# Patient Record
Sex: Female | Born: 1995 | Race: Black or African American | Hispanic: No | Marital: Single | Smoking: Never smoker
Health system: Southern US, Community
[De-identification: ages and names within clinical notes are randomized; demographics above are authoritative.]

---
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
## 2023-03-12 ENCOUNTER — Emergency Department (HOSPITAL_COMMUNITY)
Admission: EM | Admit: 2023-03-12 | Discharge: 2023-03-12 | Disposition: A | Discharge status: Home / Self Care | Payer: 59 | Attending: Emergency Medicine | Admitting: Emergency Medicine

## 2023-03-12 ENCOUNTER — Encounter (HOSPITAL_COMMUNITY): Payer: Self-pay | Admitting: Emergency Medicine

## 2023-03-12 ENCOUNTER — Other Ambulatory Visit: Payer: Self-pay

## 2023-03-12 ENCOUNTER — Emergency Department (HOSPITAL_COMMUNITY): Payer: 59

## 2023-03-12 DIAGNOSIS — R739 Hyperglycemia, unspecified: Secondary | ICD-10-CM | POA: Insufficient documentation

## 2023-03-12 DIAGNOSIS — R0602 Shortness of breath: Secondary | ICD-10-CM | POA: Insufficient documentation

## 2023-03-12 DIAGNOSIS — R112 Nausea with vomiting, unspecified: Secondary | ICD-10-CM

## 2023-03-12 DIAGNOSIS — E876 Hypokalemia: Secondary | ICD-10-CM | POA: Diagnosis not present

## 2023-03-12 DIAGNOSIS — E860 Dehydration: Secondary | ICD-10-CM | POA: Insufficient documentation

## 2023-03-12 LAB — CBC WITH DIFFERENTIAL/PLATELET
Abs Immature Granulocytes: 0.02 10*3/uL (ref 0.00–0.07)
Basophils Absolute: 0 10*3/uL (ref 0.0–0.1)
Basophils Relative: 0 %
Eosinophils Absolute: 0.1 10*3/uL (ref 0.0–0.5)
Eosinophils Relative: 1 %
HCT: 38.8 % (ref 36.0–46.0)
Hemoglobin: 12.4 g/dL (ref 12.0–15.0)
Immature Granulocytes: 0 %
Lymphocytes Relative: 57 %
Lymphs Abs: 5.2 10*3/uL — ABNORMAL HIGH (ref 0.7–4.0)
MCH: 30.8 pg (ref 26.0–34.0)
MCHC: 32 g/dL (ref 30.0–36.0)
MCV: 96.3 fL (ref 80.0–100.0)
Monocytes Absolute: 0.5 10*3/uL (ref 0.1–1.0)
Monocytes Relative: 6 %
Neutro Abs: 3.2 10*3/uL (ref 1.7–7.7)
Neutrophils Relative %: 36 %
Platelets: 348 10*3/uL (ref 150–400)
RBC: 4.03 MIL/uL (ref 3.87–5.11)
RDW: 13.2 % (ref 11.5–15.5)
WBC: 9.1 10*3/uL (ref 4.0–10.5)
nRBC: 0 % (ref 0.0–0.2)

## 2023-03-12 LAB — COMPREHENSIVE METABOLIC PANEL
ALT: 12 U/L (ref 0–44)
AST: 29 U/L (ref 15–41)
Albumin: 3.6 g/dL (ref 3.5–5.0)
Alkaline Phosphatase: 75 U/L (ref 38–126)
Anion gap: 19 — ABNORMAL HIGH (ref 5–15)
BUN: 11 mg/dL (ref 6–20)
CO2: 15 mmol/L — ABNORMAL LOW (ref 22–32)
Calcium: 8.7 mg/dL — ABNORMAL LOW (ref 8.9–10.3)
Chloride: 103 mmol/L (ref 98–111)
Creatinine, Ser: 0.91 mg/dL (ref 0.44–1.00)
GFR, Estimated: >60 mL/min (ref >60)
Glucose, Bld: 164 mg/dL — ABNORMAL HIGH (ref 70–99)
Potassium: 3.3 mmol/L — ABNORMAL LOW (ref 3.5–5.1)
Sodium: 137 mmol/L (ref 135–145)
Total Bilirubin: 0.2 mg/dL — ABNORMAL LOW (ref 0.3–1.2)
Total Protein: 6.2 g/dL — ABNORMAL LOW (ref 6.5–8.1)

## 2023-03-12 LAB — ETHANOL: Alcohol, Ethyl (B): <10 mg/dL (ref <10)

## 2023-03-12 LAB — D-DIMER, QUANTITATIVE: D-Dimer, Quant: <0.27 ug/mL-FEU (ref 0.00–0.50)

## 2023-03-12 LAB — I-STAT BETA HCG BLOOD, ED (MC, WL, AP ONLY): I-stat hCG, quantitative: <5 m[IU]/mL (ref <5)

## 2023-03-12 MED ORDER — ONDANSETRON 4 MG PO TBDP: 4.0000 mg | ORAL_TABLET | Freq: Three times a day (TID) | ORAL | 0 refills | Status: AC | PRN

## 2023-03-12 MED ORDER — SODIUM CHLORIDE 0.9 % IV BOLUS
1000.0000 mL | Freq: Once | INTRAVENOUS | Status: AC
  Administered 2023-03-12: 1000 mL via INTRAVENOUS

## 2023-03-12 MED ORDER — SODIUM CHLORIDE 0.9 % IV SOLN
8.0000 mg | Freq: Once | INTRAVENOUS | Status: AC
  Administered 2023-03-12: 8 mg via INTRAVENOUS
  Filled 2023-03-12: qty 4

## 2023-03-12 MED ORDER — POTASSIUM CHLORIDE CRYS ER 20 MEQ PO TBCR
40.0000 meq | EXTENDED_RELEASE_TABLET | Freq: Once | ORAL | Status: AC
  Administered 2023-03-12: 40 meq via ORAL
  Filled 2023-03-12: qty 2

## 2023-03-12 NOTE — ED Provider Notes (Signed)
[place] EMERGENCY DEPARTMENT AT [facility] Provider Note   CSN: [id number] Arrival date & time: 03/12/23  0035     History  Chief Complaint  Patient presents with   Shortness of Breath   Near Syncope          Rachel Jordan is a 27 y.o. female.  26 year old female with no significant past medical history presents with complaint of difficulty breathing.  Patient states that she had just smoked THC from a smoke shop when her symptoms started all of a sudden.  Patient arrived in the emergency room, had 1 episode of vomiting, felt slightly better after that however her shortness of breath returned when she was moved to a room and had to move from the wheelchair to the bed.  Denies leg swelling, recent extended travel, personal or family history of PE or DVT, or hormone therapy use.  Actively vomiting at time of exam.       Home Medications Prior to Admission medications   Medication Sig Start Date End Date Taking? Authorizing Provider  ondansetron (ZOFRAN-ODT) 4 MG disintegrating tablet Take 1 tablet (4 mg total) by mouth every 8 (eight) hours as needed for nausea or vomiting. 03/12/23  Yes Jeannie Fend, PA-C      Allergies    Patient has no allergy information on record.    Review of Systems   Review of Systems Negative except as per HPI Physical Exam Updated Vital Signs BP 111/67   Pulse 79   Temp 97.7 F (36.5 C)   Resp 16   Wt 93.4 kg   LMP 03/02/2023 (Approximate)   SpO2 100%  Physical Exam Vitals and nursing note reviewed.  Constitutional:      General: She is not in acute distress.    Appearance: She is well-developed. She is not diaphoretic.  HENT:     Head: Normocephalic and atraumatic.  Cardiovascular:     Rate and Rhythm: Normal rate and regular rhythm.  Pulmonary:     Effort: Pulmonary effort is normal. Tachypnea present.     Breath sounds: Normal breath sounds. No decreased breath sounds or wheezing.  Musculoskeletal:     Right  lower leg: No tenderness. No edema.     Left lower leg: No tenderness. No edema.  Skin:    General: Skin is warm and dry.     Findings: No erythema or rash.  Neurological:     Mental Status: She is alert and oriented to person, place, and time.  Psychiatric:        Behavior: Behavior normal.     ED Results / Procedures / Treatments   Labs (all labs ordered are listed, but only abnormal results are displayed) Labs Reviewed  COMPREHENSIVE METABOLIC PANEL - Abnormal; Notable for the following components:      Result Value   Potassium 3.3 (*)    CO2 15 (*)    Glucose, Bld 164 (*)    Calcium 8.7 (*)    Total Protein 6.2 (*)    Total Bilirubin 0.2 (*)    Anion gap 19 (*)    All other components within normal limits  CBC WITH DIFFERENTIAL/PLATELET - Abnormal; Notable for the following components:   Lymphs Abs 5.2 (*)    All other components within normal limits  ETHANOL  D-DIMER, QUANTITATIVE  RAPID URINE DRUG SCREEN, HOSP PERFORMED  I-STAT BETA HCG BLOOD, ED ([facility], [facility], [facility] ONLY)    EKG None  Radiology DG Chest 2 View  Result Date: 03/12/2023 CLINICAL DATA:  Shortness of breath. EXAM: CHEST - 2 VIEW COMPARISON:  None Available. FINDINGS: The heart size and mediastinal contours are within normal limits. Both lungs are clear. The visualized skeletal structures are unremarkable. IMPRESSION: No active cardiopulmonary disease. Electronically Signed   By: Elgie Collard M.D.   On: 03/12/2023 01:27    Procedures Procedures    Medications Ordered in ED Medications  sodium chloride 0.9 % bolus 1,000 mL (0 mLs Intravenous Stopped 03/12/23 0321)  ondansetron (ZOFRAN) 8 mg in sodium chloride 0.9 % 50 mL IVPB (0 mg Intravenous Stopped 03/12/23 0321)  potassium chloride SA (KLOR-CON M) CR tablet 40 mEq (40 mEq Oral Given 03/12/23 0413)    ED Course/ Medical Decision Making/ A&P                             Medical Decision Making Amount and/or Complexity of Data Reviewed Labs:  ordered. Radiology: ordered.   This patient presents to the ED for concern of vomiting, SHOB, this involves an extensive number of treatment options, and is a complaint that carries with it a high risk of complications and morbidity.  The differential diagnosis includes but not limited to PE, PNX, substance use   Co morbidities that complicate the patient evaluation  No significant past medical history   Additional history obtained:  External records from outside source obtained and reviewed including no prior labs on file for comparison   Lab Tests:  I Ordered, and personally interpreted labs.  The pertinent results include: EtOH negative.  hCG negative.  D-dimer negative at less than 0.27.  CBC without significant findings.  CMP with mild hypokalemia with potassium of 3.3.  Glucose mildly elevated at 164, nonfasting, nondiabetic.  Bicarb of 15 with gap 19   Imaging Studies ordered:  I ordered imaging studies including chest x-ray I independently visualized and interpreted imaging which showed no acute abnormality I agree with the radiologist interpretation   Cardiac Monitoring: / EKG:  The patient was maintained on a cardiac monitor.  I personally viewed and interpreted the cardiac monitored which showed an underlying rhythm of: Sinus tachycardia, rate 109    Problem List / ED Course / Critical interventions / Medication management  27 year old female with complaint of shortness of breath and vomiting after smoking THC.  Arrives in the ER appearing uncomfortable, actively vomiting. Lung sounds present, no wheezing. CXR reassuring, d-dimer negative. CMP consistent with hyperventilating/vomiting. Symptoms have completely resolved after zofran and iv fluids. Pt is tolerating PO fluids. Plan is for dc home, avoid THC, recheck with PCP, return to ER as needed.  I ordered medication including potassium, Zofran, IV fluids for hypokalemia, vomiting, dehydration Reevaluation of the  patient after these medicines showed that the patient resolved I have reviewed the patients home medicines and have made adjustments as needed   Social Determinants of Health:  Lives with family   Test / Admission - Considered:  Stable for discharge home to continue p.o. intake with return to ER precautions.         Final Clinical Impression(s) / ED Diagnoses Final diagnoses:  Shortness of breath  Nausea and vomiting, unspecified vomiting type  Dehydration  Hypokalemia    Rx / DC Orders ED Discharge Orders          Ordered    ondansetron (ZOFRAN-ODT) 4 MG disintegrating tablet  Every 8 hours PRN        03/12/23  0443              Jeannie Fend, PA-C 03/12/23 0544    Dione Booze, MD 03/12/23 [phone]

## 2023-03-12 NOTE — Discharge Instructions (Addendum)
Home to rest and hydrate, recheck with your PCP. Return to the ER for worsening or concerning symptoms.  Zofran as needed as prescribed for nausea and vomiting.

## 2023-03-12 NOTE — ED Triage Notes (Addendum)
Pt in with sudden onset of sob 45 min ago while lying in bed. Pt denies any pain, cough or fevers. Sob worse with exertion, + nausea. Pt also adds she smoked some "THC from the smoke shop" tonight before going to bed
